# Patient Record
Sex: Female | Born: 2000 | Race: Black or African American | Hispanic: No | Smoking: Current every day smoker
Health system: Southern US, Community
[De-identification: ages and names within clinical notes are randomized; demographics above are authoritative.]

## PROBLEM LIST (undated history)

## (undated) DIAGNOSIS — R41840 Attention and concentration deficit: Secondary | ICD-10-CM

---
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
## 2020-02-26 IMAGING — DX DG THORACIC SPINE 2V
3 series · 3 of 3 positions shown · non-contrast
Comparison: None.

CLINICAL DATA: Back pain since yesterday.  No injury.

EXAM:
THORACIC SPINE 2 VIEWS; LUMBAR SPINE - 2-3 VIEW

[t-spine ap]
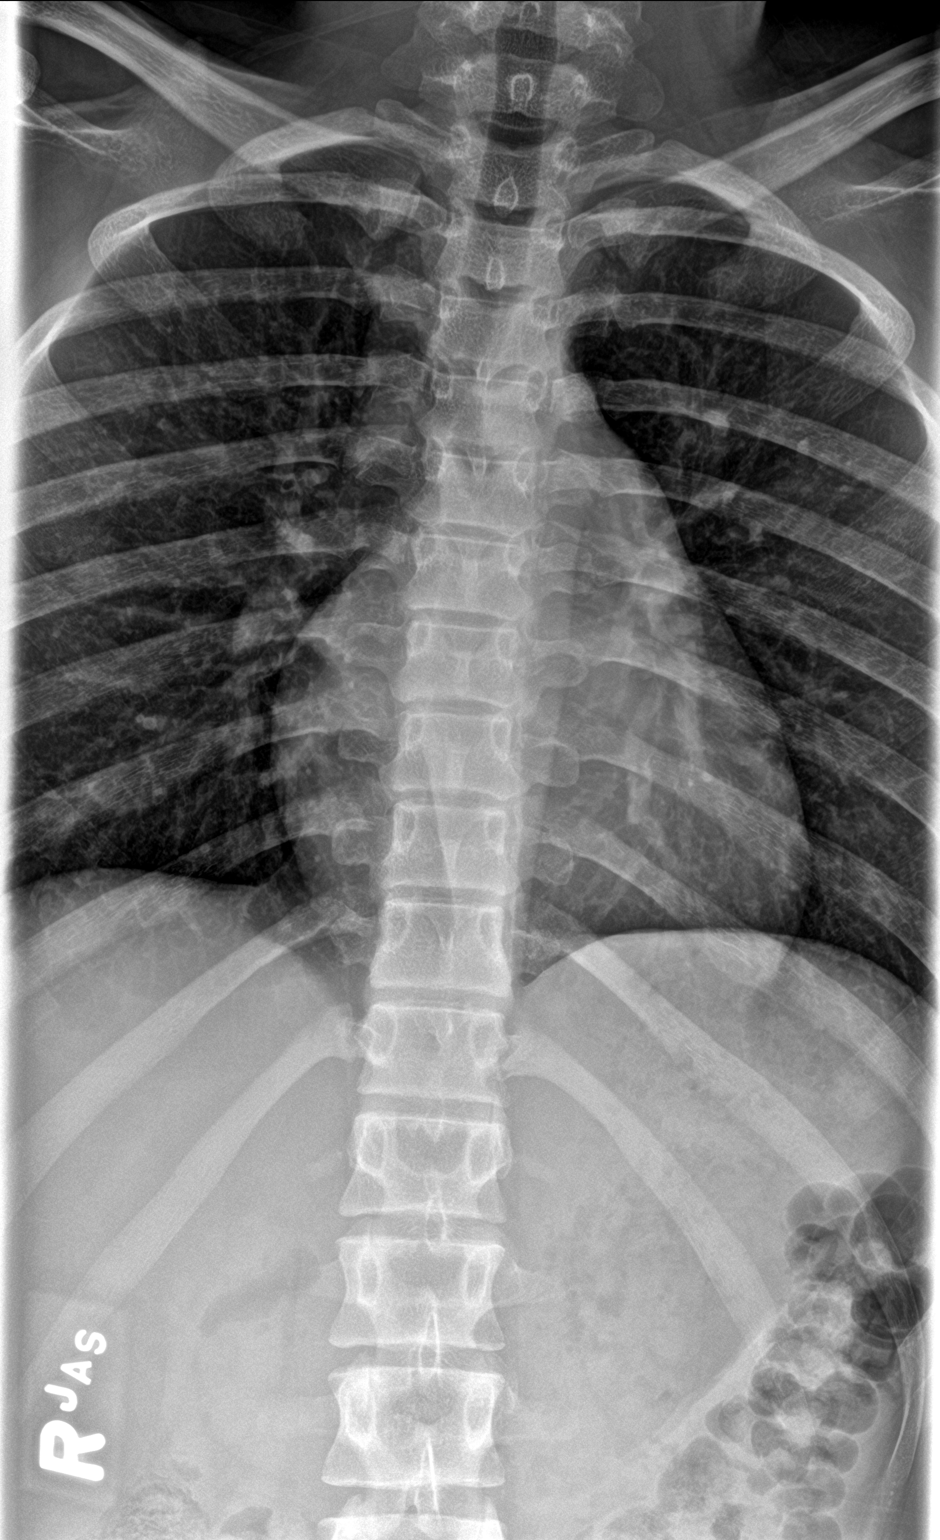

[t-spine lat]
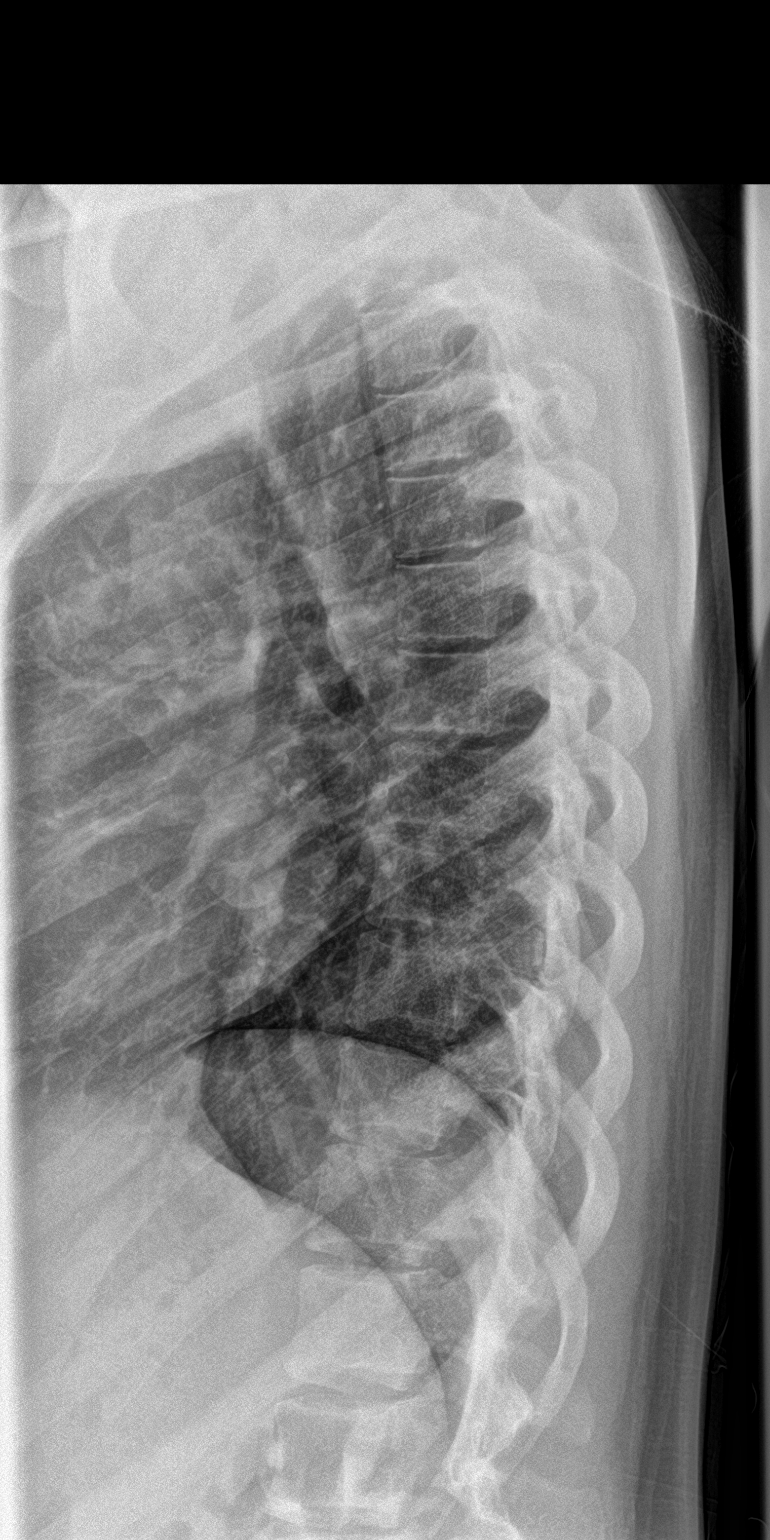

[t-spine swimmers]
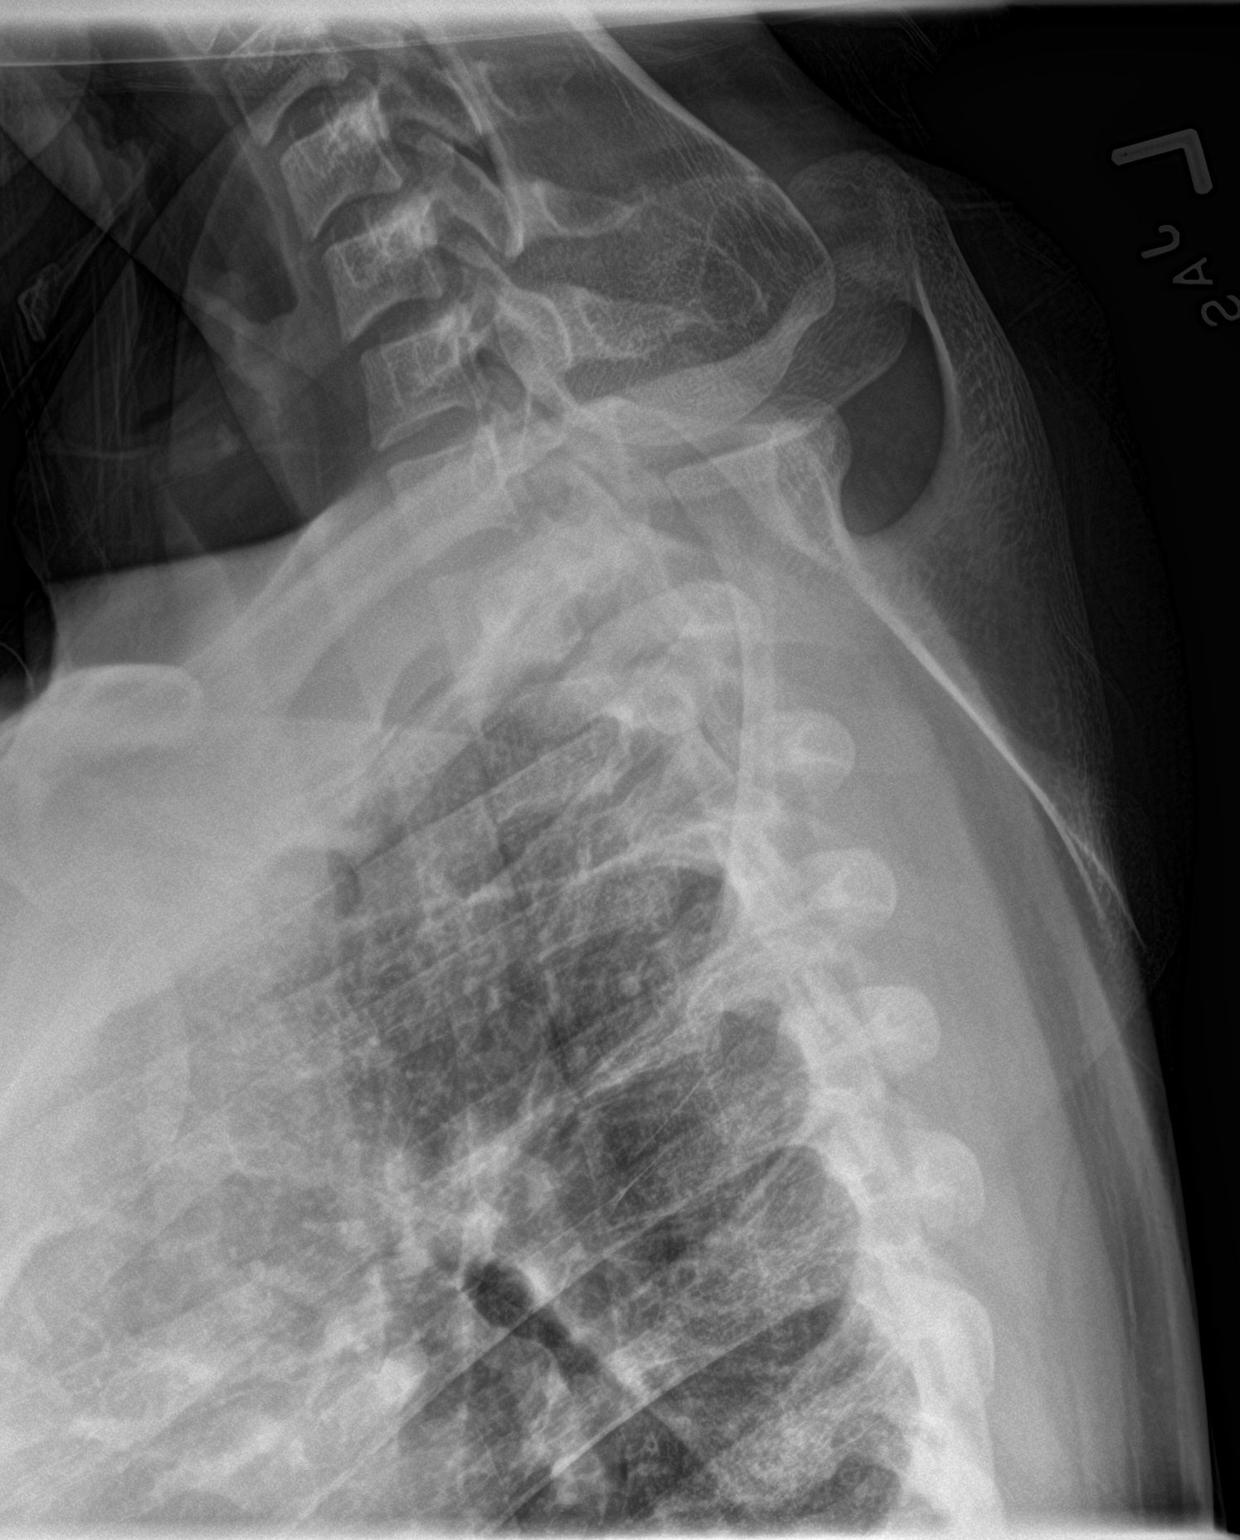

[3 of 3 positions shown; findings below may reference images not displayed]

FINDINGS: Thoracic spine: Thoracic vertebral bodies intact and aligned with
maintenance of thoracic kyphosis. Hypoplastic T12 ribs.
Intervertebral disc heights preserved. No destructive bony lesions.
Prevertebral and paraspinal soft tissue planes are non-suspicious.

Lumbar spine: Five non rib-bearing lumbar-type vertebral bodies are
intact. No malalignment. Maintained lumbar lordosis. Intervertebral
disc heights maintained. No destructive bony lesions. Sacroiliac
joints are symmetric. Included prevertebral and paraspinal soft
tissue planes are non-suspicious. Moderate volume retained large
bowel stool.
IMPRESSION: 1. Negative thoracic and lumbar spine radiographs.
2. Moderate volume retained large bowel stool.

## 2020-06-16 IMAGING — US US RENAL
1 series · 14 of 25 positions shown · non-contrast
Comparison: None.

CLINICAL DATA: Back pain.

EXAM:
RENAL / URINARY TRACT ULTRASOUND COMPLETE

[Series 1: us renal · 0.23mm/px · 14 of 33 slices shown]
[im 1/33]
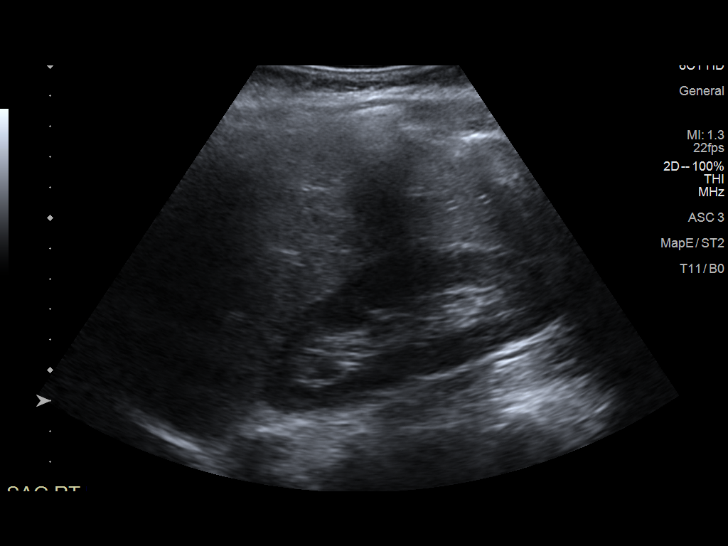
[im 3/33]
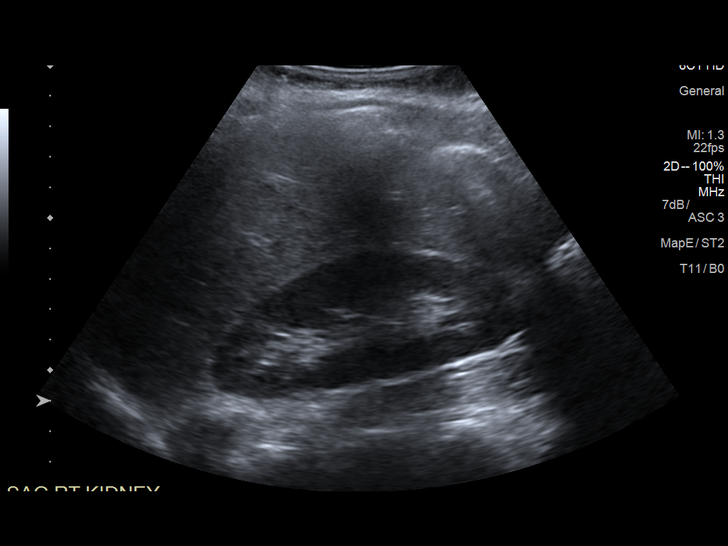
[im 6/33]
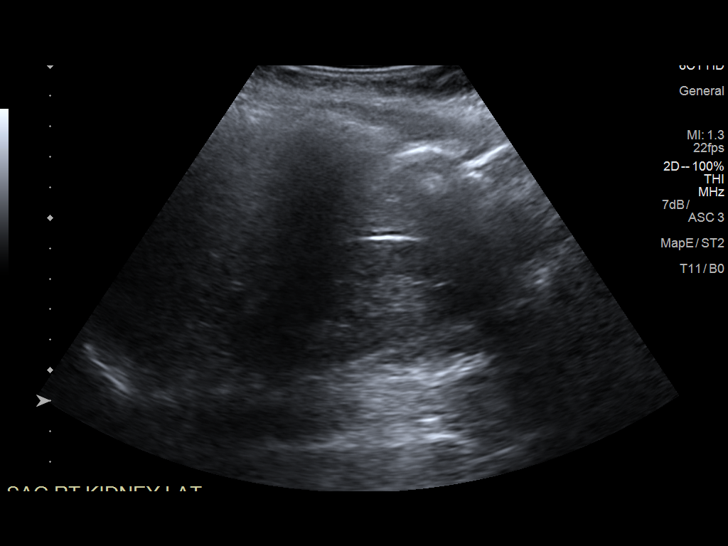
[im 9/33]
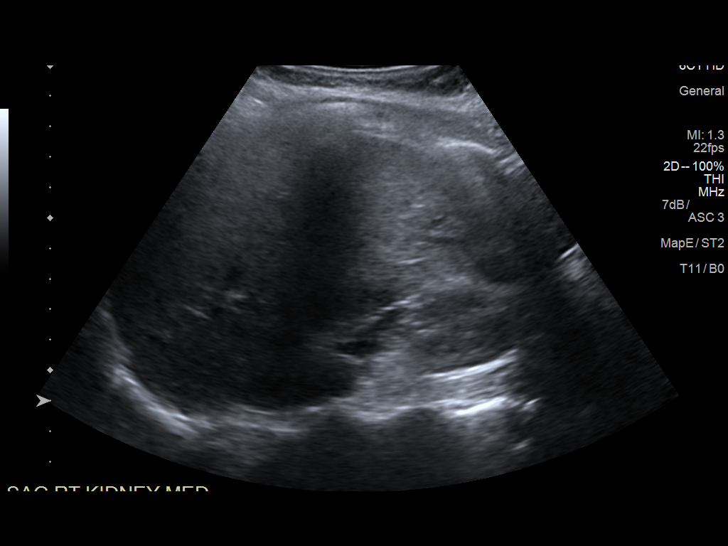
[im 11/33]
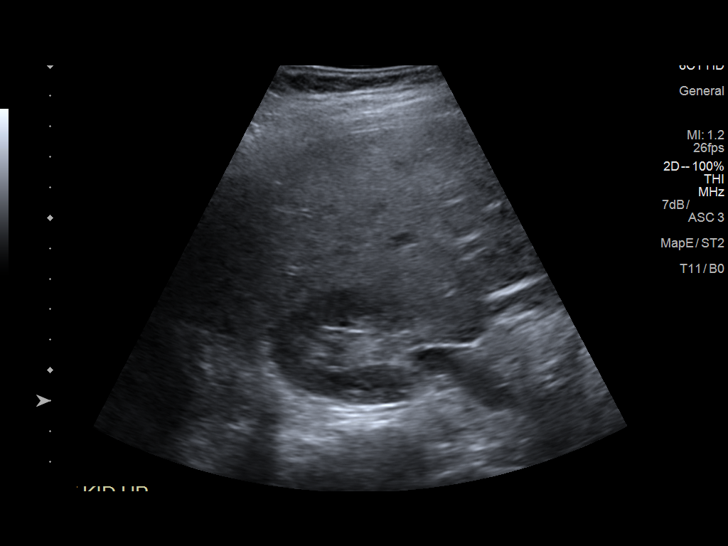
[im 13/33]
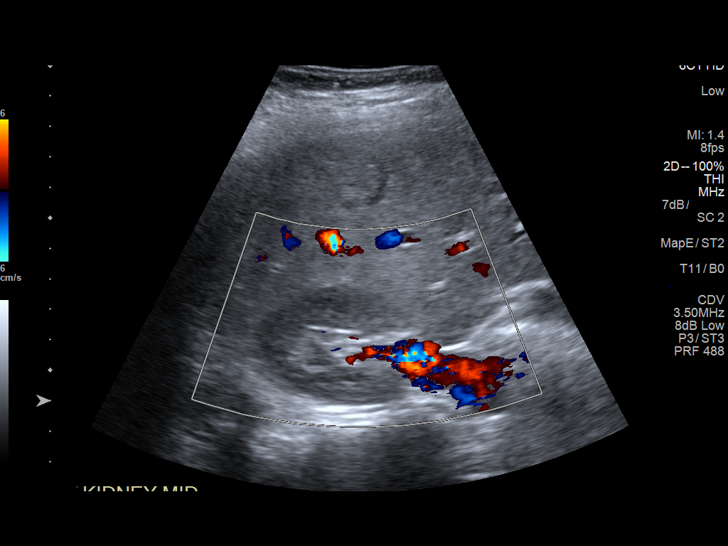
[im 15/33]
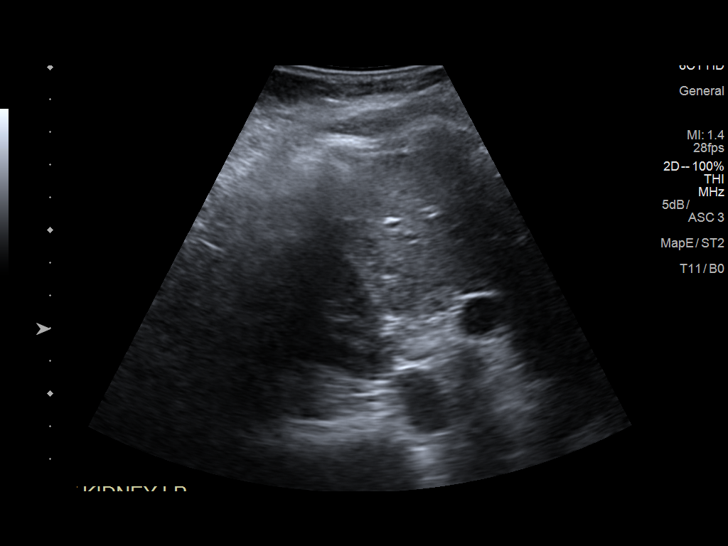
[im 18/33]
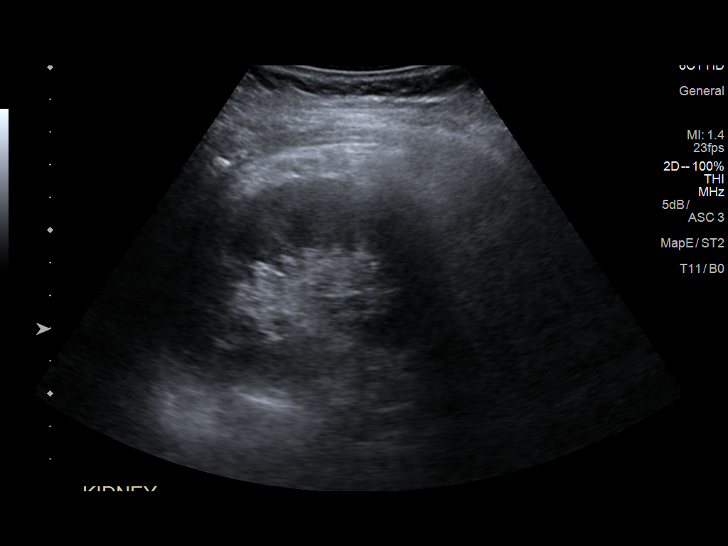
[im 21/33]
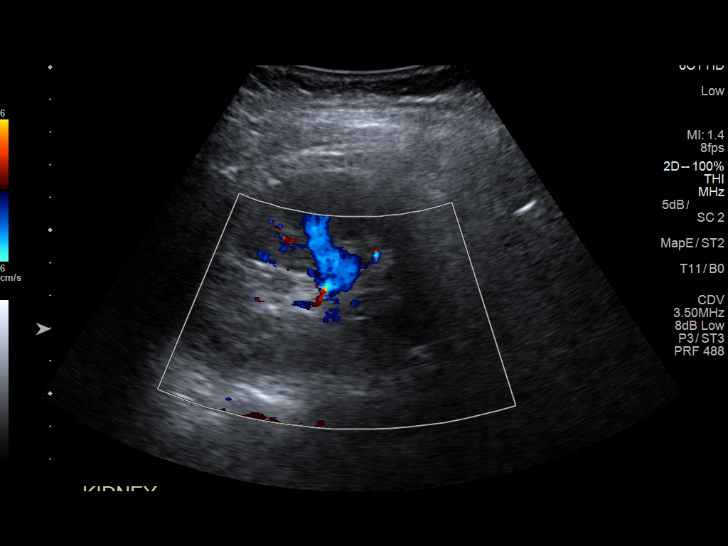
[im 22/33]
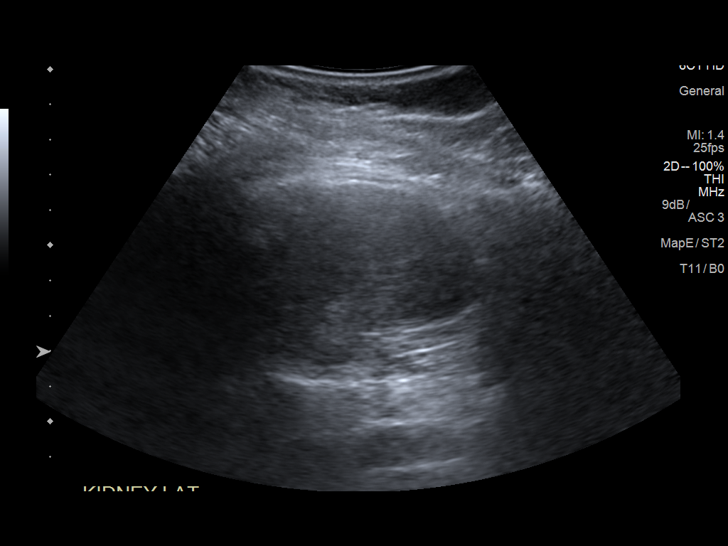
[im 25/33]
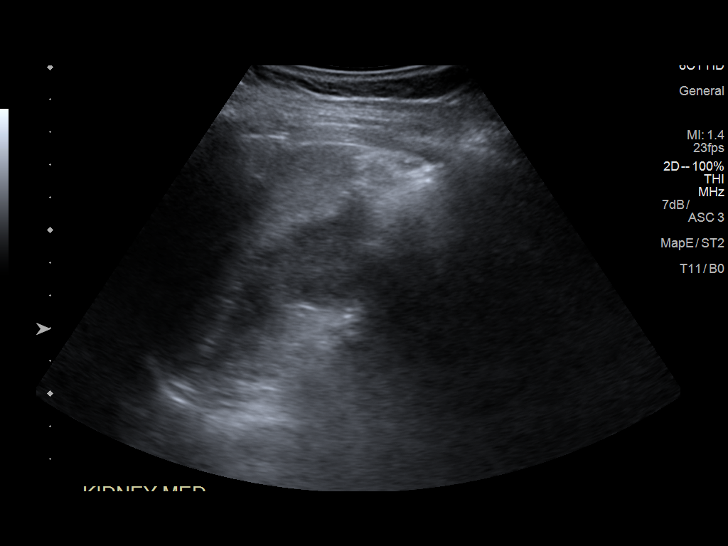
[im 27/33]
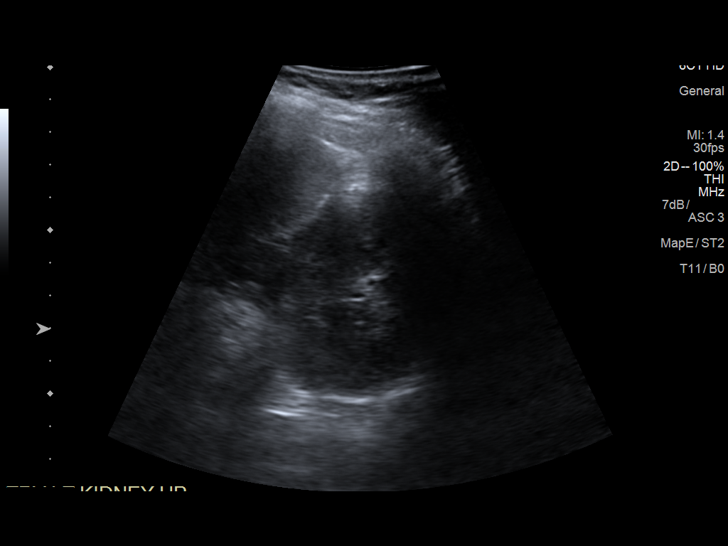
[im 30/33]
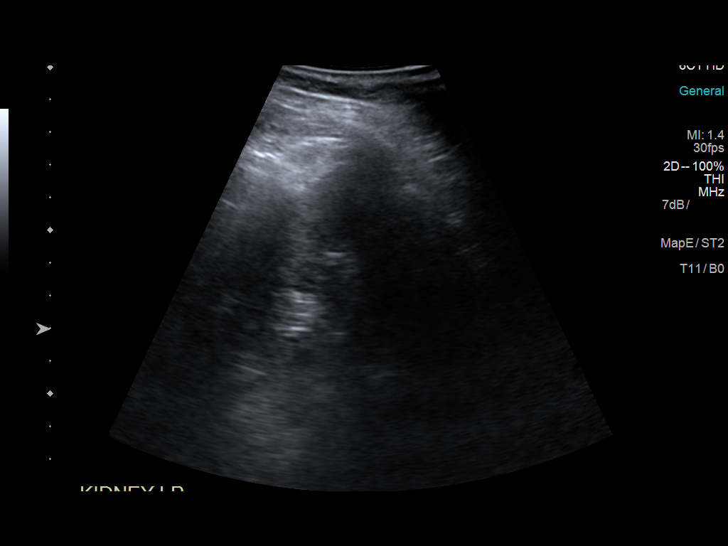
[im 33/33]
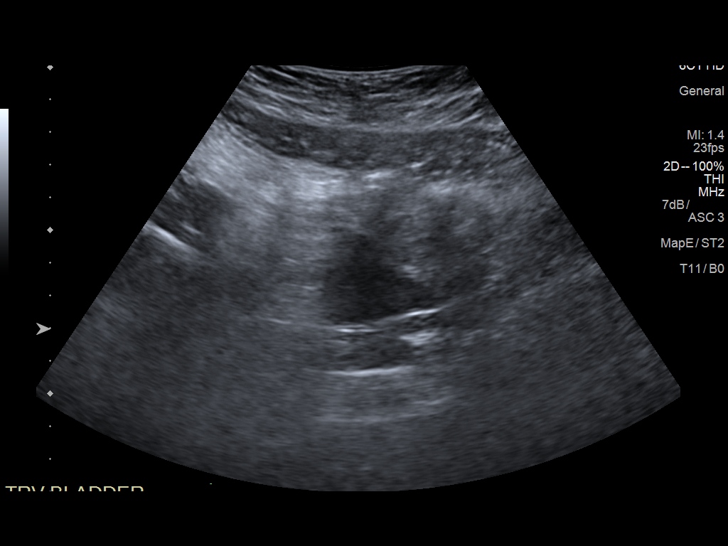

[14 of 25 positions shown; findings below may reference images not displayed]

FINDINGS: Right Kidney:

Length: 9.9 cm. Echogenicity within normal limits. No mass or
hydronephrosis visualized.

Left Kidney:

Length: 9.1 cm. Echogenicity within normal limits. No mass or
hydronephrosis visualized.

Bladder:

Decompressed.
IMPRESSION: Normal renal ultrasound.

## 2021-08-07 ENCOUNTER — Ambulatory Visit
Admission: EM | Admit: 2021-08-07 | Discharge: 2021-08-07 | Disposition: A | Discharge status: Home / Self Care | Payer: Self-pay | Attending: Family Medicine | Admitting: Family Medicine

## 2021-08-07 DIAGNOSIS — N760 Acute vaginitis: Secondary | ICD-10-CM

## 2021-08-07 LAB — POCT URINALYSIS DIP (MANUAL ENTRY)
Bilirubin, UA: NEGATIVE
Blood, UA: NEGATIVE
Glucose, UA: NEGATIVE mg/dL
Ketones, POC UA: NEGATIVE mg/dL
Leukocytes, UA: NEGATIVE
Nitrite, UA: NEGATIVE
Protein Ur, POC: NEGATIVE mg/dL
Spec Grav, UA: 1.025 (ref 1.010–1.025)
Urobilinogen, UA: 0.2 E.U./dL
pH, UA: 7 (ref 5.0–8.0)

## 2021-08-07 LAB — POCT URINE PREGNANCY: Preg Test, Ur: NEGATIVE

## 2021-08-07 MED ORDER — FLUCONAZOLE 150 MG PO TABS: 150.0000 mg | ORAL_TABLET | ORAL | 0 refills | Status: DC | PRN

## 2021-08-07 MED ORDER — NYSTATIN 100000 UNIT/GM EX CREA: TOPICAL_CREAM | CUTANEOUS | 0 refills | Status: AC

## 2021-08-07 NOTE — ED Triage Notes (Signed)
Pt c/o frequency, vaginal odor, urgency, pelvic pain, new lower back pain, vaginal discharge thick white, vaginal pruritis. ? ?Denies dysuria, hematuria,  ? ?Onset ~ 1-2 weeks ago  ?

## 2021-08-07 NOTE — Discharge Instructions (Addendum)
Your vaginal cytology will result within 24 to 48 hours.  In the meantime I have prescribed you Diflucan take 1 tablet today and repeat in 3 days as needed.  For acute itching I have prescribed you nystatin you can apply to affected area as needed up to 2 times per day. ?Your urine analysis was negative for any type of a UTI.  Your urine pregnancy was also negative. ?

## 2021-08-07 NOTE — ED Provider Notes (Signed)
?[facility] ? ? ? ?CSN: [id number] ?Arrival date & time: 08/07/21  1449 ? ? ?  ? ?History   ?Chief Complaint ?No chief complaint on file. ? ? ?HPI ?Kim Hahn is a 21 y.o. female.  ? ?HPI ?Patient presents with a 1 to 2-week history of vaginal odor, urine urgency and frequency along with low back pain, she also reports vaginal itching along with a increased thickened vaginal discharge.  Patient is unknown of when her last menstrual period was.  She is sexually active but no known exposure to any STIs. ? ?Past Medical History:  ?Diagnosis Date  ? Attention and concentration deficit   ? ? ?There are no problems to display for this patient. ? ? ?No past surgical history on file. ? ?OB History   ?No obstetric history on file. ?  ? ? ? ?Home Medications   ? ?Prior to Admission medications   ?Medication Sig Start Date End Date Taking? Authorizing Provider  ?acetaminophen (TYLENOL) 325 MG tablet Take 2 tablets (650 mg total) by mouth every 6 (six) hours as needed for mild pain or moderate pain. ?Patient not taking: Reported on 06/11/2018 12/20/17   Jean Rosenthal, NP  ?cloNIDine (CATAPRES) 0.1 MG tablet Take 0.1-0.2 mg by mouth See admin instructions. Take 1 tablet every morning and take 2 tablets at night 05/18/18   [provider]  ?ibuprofen (ADVIL,MOTRIN) 600 MG tablet Take 1 tablet (600 mg total) by mouth every 6 (six) hours as needed. 06/11/18   Larene Pickett, PA-C  ?naproxen (NAPROSYN) 500 MG tablet Take 1 tablet (500 mg total) by mouth 2 (two) times daily with a meal. 10/27/19   Jaynee Eagles, PA-C  ?tiZANidine (ZANAFLEX) 4 MG tablet Take 1 tablet (4 mg total) by mouth every 8 (eight) hours as needed for muscle spasms. 10/27/19   Jaynee Eagles, PA-C  ?VYVANSE 70 MG capsule Take 70 mg by mouth daily. 03/19/18   [provider]  ? ? ?Family History ?No family history on file. ? ?Social History ?Social History  ? ?Tobacco Use  ? Smoking status: Every Day  ?  Types: Cigars  ?  Smokeless tobacco: Never  ? Tobacco comments:  ?  Pt states that she smokes black and mild's every day   ?Substance Use Topics  ? Alcohol use: Yes  ?  Comment: occasional   ? Drug use: Yes  ?  Types: Marijuana  ? ? ? ?Allergies   ?Patient has no known allergies. ? ?Review of Systems ?Review of Systems ?Pertinent negatives listed in HPI  ? ?Physical Exam ?Triage Vital Signs ?ED Triage Vitals  ?Enc Vitals Group  ?   BP   ?   Pulse   ?   Resp   ?   Temp   ?   Temp src   ?   SpO2   ?   Weight   ?   Height   ?   Head Circumference   ?   Peak Flow   ?   Pain Score   ?   Pain Loc   ?   Pain Edu?   ?   Excl. in [place]?   ? ?No data found. ? ?Updated Vital Signs ?BP 126/87 (BP Location: Left Arm)   Pulse 87   Temp 98.1 ?F (36.7 ?C) (Oral)   Resp 18   SpO2 97%  ? ?Visual Acuity ?Right Eye Distance:   ?Left Eye Distance:   ?Bilateral Distance:   ? ?  Right Eye Near:   ?Left Eye Near:    ?Bilateral Near:    ? ?Physical Exam ?Constitutional: Patient appears well-developed, cooperative, no acute distress  ?HENT: Normocephalic, atraumatic, External right and left ear normal.  ?CVS: Regular rhythm and regular rate.  ?Pulmonary: Effort and breath sounds normal, no stridor, rhonchi, wheezes, rales.  ?Neuro: No obvious neurological deficits present on exam  ?skin: Skin is warm and dry. No rash noted. Not diaphoretic. No erythema. No pallor. ?Psychiatric: Normal mood and affect. Behavior, judgment, thought content normal.  ? ?UC Treatments / Results  ?Labs ?(all labs ordered are listed, but only abnormal results are displayed) ?Labs Reviewed - No data to display ? ?EKG ? ? ?Radiology ?No results found. ? ?Procedures ?Procedures (including critical care time) ? ?Medications Ordered in UC ?Medications - No data to display ? ?Initial Impression / Assessment and Plan / UC Course  ?I have reviewed the triage vital signs and the nursing notes. ? ?Pertinent labs & imaging results that were available during my care of the patient were reviewed  by me and considered in my medical decision making (see chart for details). ? ?  ?Treating for vaginitis.  Cytology pending to rule out STDs or other etiologies to current symptoms.  In the meantime we will treat with Diflucan and topical nystatin to help with vaginal itching.  Patient advised that results will be available within 1 to 2 days.  She was also advised that we will notify her if any of her results are abnormal and if any additional treatment is indicated. ?Final Clinical Impressions(s) / UC Diagnoses  ? ?Final diagnoses:  ?Vaginitis and vulvovaginitis  ? ?Discharge Instructions   ?None ?  ? ?ED Prescriptions   ? ? Medication Sig Dispense Auth. Provider  ? fluconazole (DIFLUCAN) 150 MG tablet Take 1 tablet (150 mg total) by mouth every three (3) days as needed. 2 tablet Scot Jun, FNP  ? nystatin cream (MYCOSTATIN) Apply to affected area 2 times daily 60 g Scot Jun, FNP  ? ?  ? ?PDMP not reviewed this encounter. ?  ?Scot Jun, FNP ?08/07/21 1639 ? ?

## 2021-08-08 ENCOUNTER — Telehealth (HOSPITAL_COMMUNITY): Payer: Self-pay | Admitting: Emergency Medicine

## 2021-08-08 LAB — CERVICOVAGINAL ANCILLARY ONLY
Bacterial Vaginitis (gardnerella): POSITIVE — AB
Candida Glabrata: NEGATIVE
Candida Vaginitis: NEGATIVE
Chlamydia: NEGATIVE
Comment: NEGATIVE
Comment: NEGATIVE
Comment: NEGATIVE
Comment: NEGATIVE
Comment: NEGATIVE
Comment: NORMAL
Neisseria Gonorrhea: NEGATIVE
Trichomonas: NEGATIVE

## 2021-08-08 MED ORDER — METRONIDAZOLE 500 MG PO TABS: 500.0000 mg | ORAL_TABLET | Freq: Two times a day (BID) | ORAL | 0 refills | Status: DC

## 2022-03-05 ENCOUNTER — Encounter: Payer: Self-pay | Admitting: Emergency Medicine

## 2022-03-05 ENCOUNTER — Ambulatory Visit
Admission: EM | Admit: 2022-03-05 | Discharge: 2022-03-05 | Disposition: A | Discharge status: Home / Self Care | Payer: Self-pay | Attending: Internal Medicine | Admitting: Internal Medicine

## 2022-03-05 DIAGNOSIS — N760 Acute vaginitis: Secondary | ICD-10-CM

## 2022-03-05 DIAGNOSIS — B9689 Other specified bacterial agents as the cause of diseases classified elsewhere: Secondary | ICD-10-CM

## 2022-03-05 LAB — POCT URINALYSIS DIP (MANUAL ENTRY)
Bilirubin, UA: NEGATIVE
Blood, UA: NEGATIVE
Glucose, UA: NEGATIVE mg/dL
Ketones, POC UA: NEGATIVE mg/dL
Leukocytes, UA: NEGATIVE
Nitrite, UA: NEGATIVE
Protein Ur, POC: NEGATIVE mg/dL
Spec Grav, UA: 1.02 (ref 1.010–1.025)
Urobilinogen, UA: 0.2 E.U./dL
pH, UA: 7 (ref 5.0–8.0)

## 2022-03-05 LAB — POCT URINE PREGNANCY: Preg Test, Ur: NEGATIVE

## 2022-03-05 MED ORDER — FLUCONAZOLE 150 MG PO TABS: 150.0000 mg | ORAL_TABLET | ORAL | 0 refills | Status: AC

## 2022-03-05 MED ORDER — METRONIDAZOLE 500 MG PO TABS: 500.0000 mg | ORAL_TABLET | Freq: Two times a day (BID) | ORAL | 0 refills | Status: DC

## 2022-03-05 NOTE — ED Triage Notes (Signed)
Vaginal odor and itching x 2 weeks, described as a "fishy" odor. Also reports mild abdominal pain and urinating more frequently since this started. Denies fever, N/V/D

## 2022-03-05 NOTE — ED Provider Notes (Signed)
[facility]  Note:  This document was prepared using Conservation officer, historic buildings and may include unintentional dictation errors.  MRN: [id number] DOB: 10/04/2000  Subjective:   Kim Hahn is a 21 y.o. female presenting for 2-week history of persistent malodorous vaginal discharge, vaginal itching.  Patient has a history of BV and yeast infections.  She also had dysuria and urinary frequency.  Has low concern for sexually transmitted infection but is not opposed to testing.  No current facility-administered medications for this encounter.  Current Outpatient Medications:    acetaminophen (TYLENOL) 325 MG tablet, Take 2 tablets (650 mg total) by mouth every 6 (six) hours as needed for mild pain or moderate pain. (Patient not taking: Reported on 06/11/2018), Disp: 30 tablet, Rfl: 0   cloNIDine (CATAPRES) 0.1 MG tablet, Take 0.1-0.2 mg by mouth See admin instructions. Take 1 tablet every morning and take 2 tablets at night, Disp: , Rfl:    fluconazole (DIFLUCAN) 150 MG tablet, Take 1 tablet (150 mg total) by mouth every three (3) days as needed., Disp: 2 tablet, Rfl: 0   ibuprofen (ADVIL,MOTRIN) 600 MG tablet, Take 1 tablet (600 mg total) by mouth every 6 (six) hours as needed., Disp: 30 tablet, Rfl: 0   metroNIDAZOLE (FLAGYL) 500 MG tablet, Take 1 tablet (500 mg total) by mouth 2 (two) times daily., Disp: 14 tablet, Rfl: 0   naproxen (NAPROSYN) 500 MG tablet, Take 1 tablet (500 mg total) by mouth 2 (two) times daily with a meal., Disp: 30 tablet, Rfl: 0   nystatin cream (MYCOSTATIN), Apply to affected area 2 times daily, Disp: 60 g, Rfl: 0   tiZANidine (ZANAFLEX) 4 MG tablet, Take 1 tablet (4 mg total) by mouth every 8 (eight) hours as needed for muscle spasms., Disp: 30 tablet, Rfl: 0   VYVANSE 70 MG capsule, Take 70 mg by mouth daily., Disp: , Rfl:    No Known Allergies  Past Medical History:  Diagnosis Date   Attention and concentration deficit       History reviewed. No pertinent surgical history.  History reviewed. No pertinent family history.  Social History   Tobacco Use   Smoking status: Every Day    Types: Cigars   Smokeless tobacco: Never   Tobacco comments:    Pt states that she smokes black and mild's every day   Substance Use Topics   Alcohol use: Yes    Comment: occasional    Drug use: Yes    Types: Marijuana    ROS   Objective:   Vitals: BP 118/81 (BP Location: Right Arm)   Pulse 80   Temp 98.5 F (36.9 C) (Oral)   Resp 16   LMP 02/09/2022   Physical Exam Constitutional:      General: She is not in acute distress.    Appearance: Normal appearance. She is well-developed. She is not ill-appearing, toxic-appearing or diaphoretic.  HENT:     Head: Normocephalic and atraumatic.     Nose: Nose normal.     Mouth/Throat:     Mouth: Mucous membranes are moist.  Eyes:     General: No scleral icterus.       Right eye: No discharge.        Left eye: No discharge.     Extraocular Movements: Extraocular movements intact.     Conjunctiva/sclera: Conjunctivae normal.  Cardiovascular:     Rate and Rhythm: Normal rate.  Pulmonary:     Effort: Pulmonary effort  is normal.  Abdominal:     General: Bowel sounds are normal. There is no distension.     Palpations: Abdomen is soft. There is no mass.     Tenderness: There is no abdominal tenderness. There is no right CVA tenderness, left CVA tenderness, guarding or rebound.  Skin:    General: Skin is warm and dry.  Neurological:     General: No focal deficit present.     Mental Status: She is alert and oriented to person, place, and time.  Psychiatric:        Mood and Affect: Mood normal.        Behavior: Behavior normal.        Thought Content: Thought content normal.        Judgment: Judgment normal.     Results for orders placed or performed during the hospital encounter of 03/05/22 (from the past 24 hour(s))  POCT urine pregnancy     Status: None    Collection Time: 03/05/22  3:31 PM  Result Value Ref Range   Preg Test, Ur Negative Negative  POCT urinalysis dipstick     Status: None   Collection Time: 03/05/22  3:31 PM  Result Value Ref Range   Color, UA yellow yellow   Clarity, UA clear clear   Glucose, UA negative negative mg/dL   Bilirubin, UA negative negative   Ketones, POC UA negative negative mg/dL   Spec Grav, UA 9.833 8.250 - 1.025   Blood, UA negative negative   pH, UA 7.0 5.0 - 8.0   Protein Ur, POC negative negative mg/dL   Urobilinogen, UA 0.2 0.2 or 1.0 E.U./dL   Nitrite, UA Negative Negative   Leukocytes, UA Negative Negative    Assessment and Plan :   PDMP not reviewed this encounter.  1. Bacterial vaginosis   2. Acute vaginitis     Recommended coverage empirically for bacterial vaginosis and yeast vaginitis with metronidazole and fluconazole.  Testing pending. Counseled patient on potential for adverse effects with medications prescribed/recommended today, ER and return-to-clinic precautions discussed, patient verbalized understanding.    Wallis Bamberg, [place] 03/05/22 1839

## 2022-03-07 LAB — CERVICOVAGINAL ANCILLARY ONLY
Bacterial Vaginitis (gardnerella): POSITIVE — AB
Chlamydia: NEGATIVE
Comment: NEGATIVE
Comment: NEGATIVE
Comment: NEGATIVE
Comment: NEGATIVE
Comment: NEGATIVE
Comment: NORMAL
Neisseria Gonorrhea: NEGATIVE

## 2022-04-17 ENCOUNTER — Emergency Department (HOSPITAL_COMMUNITY): Payer: Self-pay

## 2022-04-17 ENCOUNTER — Emergency Department (HOSPITAL_COMMUNITY)
Admission: EM | Admit: 2022-04-17 | Discharge: 2022-04-17 | Discharge status: Against Medical Advice | Payer: Self-pay | Attending: Physician Assistant | Admitting: Physician Assistant

## 2022-04-17 ENCOUNTER — Other Ambulatory Visit: Payer: Self-pay

## 2022-04-17 DIAGNOSIS — Y92410 Unspecified street and highway as the place of occurrence of the external cause: Secondary | ICD-10-CM | POA: Diagnosis not present

## 2022-04-17 DIAGNOSIS — Z5321 Procedure and treatment not carried out due to patient leaving prior to being seen by health care provider: Secondary | ICD-10-CM | POA: Insufficient documentation

## 2022-04-17 DIAGNOSIS — R519 Headache, unspecified: Secondary | ICD-10-CM | POA: Insufficient documentation

## 2022-04-17 LAB — PREGNANCY, URINE: Preg Test, Ur: NEGATIVE

## 2022-04-17 NOTE — ED Notes (Signed)
Pt called x 3 no answer 
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
## 2022-07-11 ENCOUNTER — Ambulatory Visit
Admission: EM | Admit: 2022-07-11 | Discharge: 2022-07-11 | Disposition: A | Discharge status: Home / Self Care | Payer: Self-pay | Attending: Physician Assistant | Admitting: Physician Assistant

## 2022-07-11 DIAGNOSIS — B9689 Other specified bacterial agents as the cause of diseases classified elsewhere: Secondary | ICD-10-CM

## 2022-07-11 DIAGNOSIS — N760 Acute vaginitis: Secondary | ICD-10-CM

## 2022-07-11 MED ORDER — METRONIDAZOLE 500 MG PO TABS: 500.0000 mg | ORAL_TABLET | Freq: Two times a day (BID) | ORAL | 0 refills | Status: DC

## 2022-07-11 NOTE — ED Provider Notes (Signed)
[facility]    CSN: [id number] Arrival date & time: 07/11/22  [place]      History   Chief Complaint Chief Complaint  Patient presents with   vaignal odor   Abdominal Pain    HPI Kim Hahn is a 22 y.o. female.   Patient complains of a vaginal odor and discharge patient reports she has a past medical history of getting bacterial vaginitis.  Patient reports that the last time she finished taking a prescription for metronidazole the symptoms improved but did not completely resolve.  Patient states the last time she was treated was when she was here this was in November 2023 patient denies any fever or chills she denies any burning with urination.  Patient reports that she took a pregnancy test at home today which was negative  The history is provided by the patient. No language interpreter was used.  Abdominal Pain   Past Medical History:  Diagnosis Date   Attention and concentration deficit     There are no problems to display for this patient.   History reviewed. No pertinent surgical history.  OB History   No obstetric history on file.      Home Medications    Prior to Admission medications   Medication Sig Start Date End Date Taking? Authorizing Provider  acetaminophen (TYLENOL) 325 MG tablet Take 2 tablets (650 mg total) by mouth every 6 (six) hours as needed for mild pain or moderate pain. Patient not taking: Reported on 06/11/2018 12/20/17   Jean Rosenthal, NP  cloNIDine (CATAPRES) 0.1 MG tablet Take 0.1-0.2 mg by mouth See admin instructions. Take 1 tablet every morning and take 2 tablets at night 05/18/18   [provider]  fluconazole (DIFLUCAN) 150 MG tablet Take 1 tablet (150 mg total) by mouth once a week. 03/05/22   Jaynee Eagles, PA-C  ibuprofen (ADVIL,MOTRIN) 600 MG tablet Take 1 tablet (600 mg total) by mouth every 6 (six) hours as needed. 06/11/18   Larene Pickett, PA-C  metroNIDAZOLE (FLAGYL) 500 MG tablet Take 1 tablet  (500 mg total) by mouth 2 (two) times daily. 07/11/22  Yes Caryl Ada K, PA-C  naproxen (NAPROSYN) 500 MG tablet Take 1 tablet (500 mg total) by mouth 2 (two) times daily with a meal. 10/27/19   Jaynee Eagles, PA-C  nystatin cream (MYCOSTATIN) Apply to affected area 2 times daily 08/07/21   Scot Jun, NP  tiZANidine (ZANAFLEX) 4 MG tablet Take 1 tablet (4 mg total) by mouth every 8 (eight) hours as needed for muscle spasms. 10/27/19   Jaynee Eagles, PA-C  VYVANSE 70 MG capsule Take 70 mg by mouth daily. 03/19/18   [provider]    Family History History reviewed. No pertinent family history.  Social History Social History   Tobacco Use   Smoking status: Every Day    Types: Cigars   Smokeless tobacco: Never   Tobacco comments:    Pt states that she smokes black and mild's every day   Substance Use Topics   Alcohol use: Yes    Comment: occasional    Drug use: Yes    Types: Marijuana     Allergies   Patient has no known allergies.   Review of Systems Review of Systems  Gastrointestinal:  Positive for abdominal pain.  All other systems reviewed and are negative.    Physical Exam Triage Vital Signs ED Triage Vitals  Enc Vitals Group     BP 07/11/22 1835  118/82     Pulse Rate 07/11/22 1835 87     Resp 07/11/22 1835 18     Temp 07/11/22 1835 98.1 F (36.7 C)     Temp Source 07/11/22 1835 Oral     SpO2 07/11/22 1835 97 %     Weight --      Height --      Head Circumference --      Peak Flow --      Pain Score 07/11/22 1834 6     Pain Loc --      Pain Edu? --      Excl. in [place]? --    No data found.  Updated Vital Signs BP 118/82 (BP Location: Right Arm)   Pulse 87   Temp 98.1 F (36.7 C) (Oral)   Resp 18   LMP 06/12/2022   SpO2 97%   Visual Acuity Right Eye Distance:   Left Eye Distance:   Bilateral Distance:    Right Eye Near:   Left Eye Near:    Bilateral Near:     Physical Exam Vitals and nursing note reviewed.  Constitutional:       Appearance: She is well-developed.  HENT:     Head: Normocephalic.  Pulmonary:     Effort: Pulmonary effort is normal.  Abdominal:     General: Abdomen is flat. Bowel sounds are normal. There is no distension.     Palpations: Abdomen is soft.  Musculoskeletal:        General: Normal range of motion.     Cervical back: Normal range of motion.  Skin:    Coloration: Skin is cyanotic.  Neurological:     Mental Status: She is alert and oriented to person, place, and time.      UC Treatments / Results  Labs (all labs ordered are listed, but only abnormal results are displayed) Labs Reviewed  CERVICOVAGINAL ANCILLARY ONLY    EKG   Radiology No results found.  Procedures Procedures (including critical care time)  Medications Ordered in UC Medications - No data to display  Initial Impression / Assessment and Plan / UC Course  I have reviewed the triage vital signs and the nursing notes.  Pertinent labs & imaging results that were available during my care of the patient were reviewed by me and considered in my medical decision making (see chart for details).     Did a vaginal swab I will send testing for gonorrhea chlamydia trichomonas and bacterial vaginitis.  Patient is given a prescription for metronidazole. Final Clinical Impressions(s) / UC Diagnoses   Final diagnoses:  Bacterial vaginitis     Discharge Instructions      Return if any problems.    ED Prescriptions     Medication Sig Dispense Auth. Provider   metroNIDAZOLE (FLAGYL) 500 MG tablet Take 1 tablet (500 mg total) by mouth 2 (two) times daily. 14 tablet Fransico Meadow, [place]      PDMP not reviewed this encounter. An After Visit Summary was printed and given to the patient.    Fransico Meadow, [place] 07/11/22 [phone]

## 2022-07-11 NOTE — ED Triage Notes (Signed)
Pt states 2 weeks ago she began having vaginal odor and abd pain.  Home interventions: none

## 2022-07-11 NOTE — Discharge Instructions (Signed)
Return if any problems.

## 2022-07-14 LAB — CERVICOVAGINAL ANCILLARY ONLY
Bacterial Vaginitis (gardnerella): POSITIVE — AB
Candida Glabrata: NEGATIVE
Candida Vaginitis: NEGATIVE
Chlamydia: NEGATIVE
Comment: NEGATIVE
Comment: NEGATIVE
Comment: NEGATIVE
Comment: NEGATIVE
Comment: NEGATIVE
Comment: NORMAL
Neisseria Gonorrhea: NEGATIVE
Trichomonas: NEGATIVE

## 2022-11-02 ENCOUNTER — Ambulatory Visit
Admission: EM | Admit: 2022-11-02 | Discharge: 2022-11-02 | Disposition: A | Discharge status: Home / Self Care | Payer: Self-pay | Attending: Internal Medicine | Admitting: Internal Medicine

## 2022-11-02 DIAGNOSIS — N898 Other specified noninflammatory disorders of vagina: Secondary | ICD-10-CM | POA: Insufficient documentation

## 2022-11-02 DIAGNOSIS — Z113 Encounter for screening for infections with a predominantly sexual mode of transmission: Secondary | ICD-10-CM | POA: Insufficient documentation

## 2022-11-02 MED ORDER — METRONIDAZOLE 500 MG PO TABS: 500.0000 mg | ORAL_TABLET | Freq: Two times a day (BID) | ORAL | 0 refills | Status: DC

## 2022-11-02 NOTE — ED Triage Notes (Signed)
Pt presents to UC w/ c/o "fishy smell" after urinating for 2 weeks. Hx BV.

## 2022-11-02 NOTE — Discharge Instructions (Addendum)
Start Flagyl twice daily for 7 days.  The clinic will contact you with results of the testing done today if positive.  Please follow-up with your PCP or gynecologist if symptoms do not improve.  Please go to the ER for any worsening symptoms.  I hope you feel better soon!

## 2022-11-02 NOTE — ED Provider Notes (Signed)
[facility]    CSN: [id number] Arrival date & time: 11/02/22  1141      History   Chief Complaint No chief complaint on file.   HPI Kim Hahn is a 22 y.o. female presents for evaluation of vaginal odor.  Patient reports 2 weeks of a fishy smelling vaginal odor.  She denies any vaginal discharge, dysuria, fevers, nausea/vomiting, flank pain.  No STD exposure but would like screening while here.  Does have a history of BV and states this feels similar.  No OTC medications have been used since onset.  No other concerns at this time.  HPI  Past Medical History:  Diagnosis Date   Attention and concentration deficit     There are no problems to display for this patient.   History reviewed. No pertinent surgical history.  OB History   No obstetric history on file.      Home Medications    Prior to Admission medications   Medication Sig Start Date End Date Taking? Authorizing Provider  metroNIDAZOLE (FLAGYL) 500 MG tablet Take 1 tablet (500 mg total) by mouth 2 (two) times daily. 11/02/22  Yes Radford Pax, NP  acetaminophen (TYLENOL) 325 MG tablet Take 2 tablets (650 mg total) by mouth every 6 (six) hours as needed for mild pain or moderate pain. Patient not taking: Reported on 06/11/2018 12/20/17   Sherrilee Gilles, NP  cloNIDine (CATAPRES) 0.1 MG tablet Take 0.1-0.2 mg by mouth See admin instructions. Take 1 tablet every morning and take 2 tablets at night 05/18/18   [provider]  fluconazole (DIFLUCAN) 150 MG tablet Take 1 tablet (150 mg total) by mouth once a week. 03/05/22   Wallis Bamberg, PA-C  ibuprofen (ADVIL,MOTRIN) 600 MG tablet Take 1 tablet (600 mg total) by mouth every 6 (six) hours as needed. 06/11/18   Garlon Hatchet, PA-C  naproxen (NAPROSYN) 500 MG tablet Take 1 tablet (500 mg total) by mouth 2 (two) times daily with a meal. 10/27/19   Wallis Bamberg, PA-C  nystatin cream (MYCOSTATIN) Apply to affected area 2 times daily 08/07/21    Bing Neighbors, NP  tiZANidine (ZANAFLEX) 4 MG tablet Take 1 tablet (4 mg total) by mouth every 8 (eight) hours as needed for muscle spasms. 10/27/19   Wallis Bamberg, PA-C  VYVANSE 70 MG capsule Take 70 mg by mouth daily. 03/19/18   [provider]    Family History History reviewed. No pertinent family history.  Social History Social History   Tobacco Use   Smoking status: Every Day    Types: Cigars   Smokeless tobacco: Never   Tobacco comments:    Pt states that she smokes black and mild's every day   Substance Use Topics   Alcohol use: Yes    Comment: occasional    Drug use: Yes    Types: Marijuana     Allergies   Patient has no known allergies.   Review of Systems Review of Systems  Genitourinary:        Vaginal odor     Physical Exam Triage Vital Signs ED Triage Vitals  Encounter Vitals Group     BP 11/02/22 1310 135/86     Systolic BP Percentile --      Diastolic BP Percentile --      Pulse Rate 11/02/22 1310 69     Resp 11/02/22 1310 16     Temp 11/02/22 1310 97.9 F (36.6 C)     Temp  Source 11/02/22 1310 Oral     SpO2 11/02/22 1310 97 %     Weight --      Height --      Head Circumference --      Peak Flow --      Pain Score 11/02/22 1312 0     Pain Loc --      Pain Education --      Exclude from Growth Chart --    No data found.  Updated Vital Signs BP 135/86 (BP Location: Left Arm)   Pulse 69   Temp 97.9 F (36.6 C) (Oral)   Resp 16   LMP 10/31/2022   SpO2 97%   Visual Acuity Right Eye Distance:   Left Eye Distance:   Bilateral Distance:    Right Eye Near:   Left Eye Near:    Bilateral Near:     Physical Exam Vitals and nursing note reviewed.  Constitutional:      Appearance: Normal appearance.  HENT:     Head: Normocephalic and atraumatic.  Eyes:     Pupils: Pupils are equal, round, and reactive to light.  Cardiovascular:     Rate and Rhythm: Normal rate.  Pulmonary:     Effort: Pulmonary effort is normal.   Abdominal:     Tenderness: There is no right CVA tenderness or left CVA tenderness.  Skin:    General: Skin is warm and dry.  Neurological:     General: No focal deficit present.     Mental Status: She is alert and oriented to person, place, and time.  Psychiatric:        Mood and Affect: Mood normal.        Behavior: Behavior normal.      UC Treatments / Results  Labs (all labs ordered are listed, but only abnormal results are displayed) Labs Reviewed  CERVICOVAGINAL ANCILLARY ONLY    EKG   Radiology No results found.  Procedures Procedures (including critical care time)  Medications Ordered in UC Medications - No data to display  Initial Impression / Assessment and Plan / UC Course  I have reviewed the triage vital signs and the nursing notes.  Pertinent labs & imaging results that were available during my care of the patient were reviewed by me and considered in my medical decision making (see chart for details).     Reviewed exam and symptoms with patient.  No red flags.  Patient unable to urinate and did not want to wait to try to provide a sample.  She did do the vaginal swab and will contact her with those results.  Will do metronidazole to treat BV.  Additional treatment pending vaginal swab results.  PCP or GYN follow-up if symptoms do not improve.  ER precautions reviewed and patient verbalized understanding. Final Clinical Impressions(s) / UC Diagnoses   Final diagnoses:  Vaginal odor  Screening examination for STD (sexually transmitted disease)     Discharge Instructions      Start Flagyl twice daily for 7 days.  The clinic will contact you with results of the testing done today if positive.  Please follow-up with your PCP or gynecologist if symptoms do not improve.  Please go to the ER for any worsening symptoms.  I hope you feel better soon!   ED Prescriptions     Medication Sig Dispense Auth. Provider   metroNIDAZOLE (FLAGYL) 500 MG tablet  Take 1 tablet (500 mg total) by mouth 2 (two) times daily. 14  tablet Radford Pax, NP      PDMP not reviewed this encounter.   Radford Pax, NP 11/02/22 1324

## 2022-11-03 LAB — CERVICOVAGINAL ANCILLARY ONLY
Bacterial Vaginitis (gardnerella): POSITIVE — AB
Candida Glabrata: NEGATIVE
Candida Vaginitis: NEGATIVE
Chlamydia: NEGATIVE
Comment: NEGATIVE
Comment: NEGATIVE
Comment: NEGATIVE
Comment: NEGATIVE
Comment: NEGATIVE
Comment: NORMAL
Neisseria Gonorrhea: NEGATIVE
Trichomonas: NEGATIVE

## 2023-01-19 ENCOUNTER — Ambulatory Visit
Admission: EM | Admit: 2023-01-19 | Discharge: 2023-01-19 | Disposition: A | Discharge status: Home / Self Care | Payer: Self-pay | Attending: Internal Medicine | Admitting: Internal Medicine

## 2023-01-19 DIAGNOSIS — Z113 Encounter for screening for infections with a predominantly sexual mode of transmission: Secondary | ICD-10-CM | POA: Insufficient documentation

## 2023-01-19 DIAGNOSIS — N760 Acute vaginitis: Secondary | ICD-10-CM | POA: Insufficient documentation

## 2023-01-19 LAB — POCT URINALYSIS DIP (MANUAL ENTRY)
Bilirubin, UA: NEGATIVE
Blood, UA: NEGATIVE
Glucose, UA: NEGATIVE mg/dL
Ketones, POC UA: NEGATIVE mg/dL
Leukocytes, UA: NEGATIVE
Nitrite, UA: NEGATIVE
Protein Ur, POC: NEGATIVE mg/dL
Spec Grav, UA: 1.02 (ref 1.010–1.025)
Urobilinogen, UA: 0.2 U/dL
pH, UA: 7 (ref 5.0–8.0)

## 2023-01-19 LAB — POCT URINE PREGNANCY: Preg Test, Ur: NEGATIVE

## 2023-01-19 MED ORDER — METRONIDAZOLE 500 MG PO TABS: 500.0000 mg | ORAL_TABLET | Freq: Two times a day (BID) | ORAL | 0 refills | Status: AC

## 2023-01-19 NOTE — ED Triage Notes (Signed)
Pt presents with c/o vaginal itching and and discharge X 2 wks.

## 2023-01-19 NOTE — ED Provider Notes (Signed)
[facility]    CSN: [id number] Arrival date & time: 01/19/23  1437      History   Chief Complaint Chief Complaint  Patient presents with   Vaginal Itching    HPI Kim Hahn is a 22 y.o. female presents for discharge.  Patient reports 2 weeks of a malodorous pruritic vaginal discharge.  She also endorses some urinary frequency but denies dysuria, urgency, hematuria.  No fevers, nausea/vomiting, flank pain.  No known STD exposure but would like screening while here.  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  No OTC medications have been used since onset.  No other concerns at this time.   Vaginal Itching    Past Medical History:  Diagnosis Date   Attention and concentration deficit     There are no problems to display for this patient.   History reviewed. No pertinent surgical history.  OB History   No obstetric history on file.      Home Medications    Prior to Admission medications   Medication Sig Start Date End Date Taking? Authorizing Provider  metroNIDAZOLE (FLAGYL) 500 MG tablet Take 1 tablet (500 mg total) by mouth 2 (two) times daily. 01/19/23  Yes Radford Pax, NP  acetaminophen (TYLENOL) 325 MG tablet Take 2 tablets (650 mg total) by mouth every 6 (six) hours as needed for mild pain or moderate pain. Patient not taking: Reported on 06/11/2018 12/20/17   Sherrilee Gilles, NP  cloNIDine (CATAPRES) 0.1 MG tablet Take 0.1-0.2 mg by mouth See admin instructions. Take 1 tablet every morning and take 2 tablets at night 05/18/18   [provider]  fluconazole (DIFLUCAN) 150 MG tablet Take 1 tablet (150 mg total) by mouth once a week. 03/05/22   Wallis Bamberg, PA-C  ibuprofen (ADVIL,MOTRIN) 600 MG tablet Take 1 tablet (600 mg total) by mouth every 6 (six) hours as needed. 06/11/18   Garlon Hatchet, PA-C  naproxen (NAPROSYN) 500 MG tablet Take 1 tablet (500 mg total) by mouth 2  (two) times daily with a meal. 10/27/19   Wallis Bamberg, PA-C  nystatin cream (MYCOSTATIN) Apply to affected area 2 times daily 08/07/21   Bing Neighbors, NP  tiZANidine (ZANAFLEX) 4 MG tablet Take 1 tablet (4 mg total) by mouth every 8 (eight) hours as needed for muscle spasms. 10/27/19   Wallis Bamberg, PA-C  VYVANSE 70 MG capsule Take 70 mg by mouth daily. 03/19/18   [provider]    Family History History reviewed. No pertinent family history.  Social History Social History   Tobacco Use   Smoking status: Every Day    Types: Cigars   Smokeless tobacco: Never   Tobacco comments:    Pt states that she smokes black and mild's every day   Substance Use Topics   Alcohol use: Yes    Comment: occasional    Drug use: Yes    Types: Marijuana     Allergies   Patient has no known allergies.   Review of Systems Review of Systems  Genitourinary:  Positive for vaginal discharge.     Physical Exam Triage Vital Signs ED Triage Vitals  Encounter Vitals Group     BP 01/19/23 1521 114/84     Systolic BP Percentile --      Diastolic BP Percentile --      Pulse Rate 01/19/23 1521 84     Resp 01/19/23 1521 16  Temp --      Temp src --      SpO2 01/19/23 1521 96 %     Weight --      Height --      Head Circumference --      Peak Flow --      Pain Score 01/19/23 1520 0     Pain Loc --      Pain Education --      Exclude from Growth Chart --    No data found.  Updated Vital Signs BP 114/84 (BP Location: Right Arm)   Pulse 84   Resp 16   LMP 11/23/2022 (Exact Date)   SpO2 96%   Visual Acuity Right Eye Distance:   Left Eye Distance:   Bilateral Distance:    Right Eye Near:   Left Eye Near:    Bilateral Near:     Physical Exam Vitals and nursing note reviewed.  Constitutional:      General: She is not in acute distress.    Appearance: Normal appearance. She is not ill-appearing.  HENT:     Head: Normocephalic and atraumatic.  Cardiovascular:     Rate  and Rhythm: Normal rate.  Pulmonary:     Effort: Pulmonary effort is normal.  Abdominal:     Tenderness: There is no right CVA tenderness or left CVA tenderness.  Skin:    General: Skin is warm and dry.  Neurological:     General: No focal deficit present.     Mental Status: She is alert and oriented to person, place, and time.  Psychiatric:        Mood and Affect: Mood normal.        Behavior: Behavior normal.      UC Treatments / Results  Labs (all labs ordered are listed, but only abnormal results are displayed) Labs Reviewed  RPR  HIV ANTIBODY (ROUTINE TESTING W REFLEX)  POCT URINALYSIS DIP (MANUAL ENTRY)  POCT URINE PREGNANCY  CERVICOVAGINAL ANCILLARY ONLY    EKG   Radiology No results found.  Procedures Procedures (including critical care time)  Medications Ordered in UC Medications - No data to display  Initial Impression / Assessment and Plan / UC Course  I have reviewed the triage vital signs and the nursing notes.  Pertinent labs & imaging results that were available during my care of the patient were reviewed by me and considered in my medical decision making (see chart for details).     UA and hCG negative.  Vaginal swab/STD testing is ordered and will contact for any positive results.  Will start Flagyl as patient reports symptoms consistent with previous BV infections.  Side effect profile reviewed. PCP or Gyn follow up if symptoms are not improving. ER precautions reviewed.  Final Clinical Impressions(s) / UC Diagnoses   Final diagnoses:  Acute vaginitis  Screening examination for STD (sexually transmitted disease)     Discharge Instructions      The clinic will contact you with results of the testing done today if positive.  Please start Flagyl twice daily for 7 days.  Please follow-up with your PCP or gynecologist in 2 days for recheck.  Please go to the ER for any worsening symptoms.  I hope you feel better soon!     ED Prescriptions      Medication Sig Dispense Auth. Provider   metroNIDAZOLE (FLAGYL) 500 MG tablet Take 1 tablet (500 mg total) by mouth 2 (two) times daily. 14 tablet [place]ie [place],  [place]ipolito Bayley, NP      PDMP not reviewed this encounter.   Radford Pax, NP 01/19/23 [phone]

## 2023-01-19 NOTE — Discharge Instructions (Signed)
The clinic will contact you with results of the testing done today if positive.  Please start Flagyl twice daily for 7 days.  Please follow-up with your PCP or gynecologist in 2 days for recheck.  Please go to the ER for any worsening symptoms.  I hope you feel better soon!

## 2023-01-20 LAB — CERVICOVAGINAL ANCILLARY ONLY
Bacterial Vaginitis (gardnerella): POSITIVE — AB
Candida Glabrata: NEGATIVE
Candida Vaginitis: NEGATIVE
Chlamydia: NEGATIVE
Comment: NEGATIVE
Comment: NEGATIVE
Comment: NEGATIVE
Comment: NEGATIVE
Comment: NEGATIVE
Comment: NORMAL
Neisseria Gonorrhea: POSITIVE — AB
Trichomonas: NEGATIVE

## 2023-01-20 LAB — RPR: RPR Ser Ql: NONREACTIVE

## 2023-01-20 LAB — HIV ANTIBODY (ROUTINE TESTING W REFLEX): HIV Screen 4th Generation wRfx: NONREACTIVE

## 2023-01-21 ENCOUNTER — Telehealth: Payer: Self-pay

## 2023-01-21 NOTE — Telephone Encounter (Signed)
Per protocol, pt will need to return to UC for Nurse Visit for Rocephin 500mg  IM.  Attempted to reach patient x1. LVM.
